# Patient Record
Sex: Male | Born: 1985 | Race: White | Hispanic: No | Marital: Single | Smoking: Former smoker
Health system: Southern US, Community
[De-identification: ages and names within clinical notes are randomized; demographics above are authoritative.]

## PROBLEM LIST (undated history)

## (undated) DIAGNOSIS — G4730 Sleep apnea, unspecified: Secondary | ICD-10-CM

## (undated) DIAGNOSIS — M7580 Other shoulder lesions, unspecified shoulder: Secondary | ICD-10-CM

## (undated) DIAGNOSIS — K219 Gastro-esophageal reflux disease without esophagitis: Secondary | ICD-10-CM

## (undated) DIAGNOSIS — F32A Depression, unspecified: Secondary | ICD-10-CM

## (undated) DIAGNOSIS — F419 Anxiety disorder, unspecified: Secondary | ICD-10-CM

## (undated) DIAGNOSIS — F329 Major depressive disorder, single episode, unspecified: Secondary | ICD-10-CM

## (undated) DIAGNOSIS — M1990 Unspecified osteoarthritis, unspecified site: Secondary | ICD-10-CM

## (undated) DIAGNOSIS — T7840XA Allergy, unspecified, initial encounter: Secondary | ICD-10-CM

## (undated) HISTORY — DX: Sleep apnea, unspecified: G47.30

## (undated) HISTORY — DX: Allergy, unspecified, initial encounter: T78.40XA

## (undated) HISTORY — DX: Anxiety disorder, unspecified: F41.9

## (undated) HISTORY — DX: Depression, unspecified: F32.A

## (undated) HISTORY — DX: Unspecified osteoarthritis, unspecified site: M19.90

## (undated) HISTORY — DX: Gastro-esophageal reflux disease without esophagitis: K21.9

---
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
## 2019-04-06 ENCOUNTER — Other Ambulatory Visit: Payer: Self-pay

## 2019-04-06 ENCOUNTER — Emergency Department (HOSPITAL_COMMUNITY)
Admission: EM | Admit: 2019-04-06 | Discharge: 2019-04-06 | Disposition: A | Discharge status: Home / Self Care | Payer: Medicaid - Out of State | Attending: Emergency Medicine | Admitting: Emergency Medicine

## 2019-04-06 ENCOUNTER — Encounter (HOSPITAL_COMMUNITY): Payer: Self-pay | Admitting: Emergency Medicine

## 2019-04-06 DIAGNOSIS — T162XXA Foreign body in left ear, initial encounter: Secondary | ICD-10-CM | POA: Diagnosis present

## 2019-04-06 DIAGNOSIS — Y929 Unspecified place or not applicable: Secondary | ICD-10-CM | POA: Diagnosis not present

## 2019-04-06 DIAGNOSIS — Y939 Activity, unspecified: Secondary | ICD-10-CM | POA: Insufficient documentation

## 2019-04-06 DIAGNOSIS — Z87891 Personal history of nicotine dependence: Secondary | ICD-10-CM | POA: Insufficient documentation

## 2019-04-06 DIAGNOSIS — Y999 Unspecified external cause status: Secondary | ICD-10-CM | POA: Diagnosis not present

## 2019-04-06 DIAGNOSIS — Y33XXXA Other specified events, undetermined intent, initial encounter: Secondary | ICD-10-CM | POA: Insufficient documentation

## 2019-04-06 MED ORDER — MINERAL OIL PO OIL
TOPICAL_OIL | Freq: Once | ORAL | Status: DC
  Filled 2019-04-06 (×2): qty 30

## 2019-04-06 NOTE — Discharge Instructions (Addendum)
Follow-up with the ear doctor as necessary.  Return to the ED with new or worsening symptoms

## 2019-04-06 NOTE — ED Triage Notes (Signed)
Pt states he was woken up by a bug "crawling in my ear."

## 2019-04-06 NOTE — ED Provider Notes (Signed)
[facility] EMERGENCY DEPARTMENT Provider Note   CSN: [id number] Arrival date & time: 04/06/19  0142     History   Chief Complaint Chief Complaint  Patient presents with  . Foreign Body in Ear    HPI Walter Lambert is a 33 y.o. male.     Patient presents with foreign body in his left ear. He believes it is a bug. States it woke him from sleep with a crawling sensation and pain in his left ear.  This happened around 12:30 PM.  He denies any drainage or bleeding.  He states he can still hear out of his ear.  No other medical history.  The history is provided by the patient.  Foreign Body in Ear Pertinent negatives include no chest pain, no abdominal pain, no headaches and no shortness of breath.    History reviewed. No pertinent past medical history.  There are no active problems to display for this patient.   History reviewed. No pertinent surgical history.      Home Medications    Prior to Admission medications   Not on File    Family History No family history on file.  Social History Social History   Tobacco Use  . Smoking status: Former Games developermoker  . Smokeless tobacco: Never Used  Substance Use Topics  . Alcohol use: Never    Frequency: Never  . Drug use: Never     Allergies   Nsaids   Review of Systems Review of Systems  Constitutional: Negative for activity change, appetite change and fever.  HENT: Positive for ear pain. Negative for ear discharge.   Eyes: Negative for visual disturbance.  Respiratory: Negative for cough, chest tightness and shortness of breath.   Cardiovascular: Negative for chest pain.  Gastrointestinal: Negative for abdominal pain, nausea and vomiting.  Genitourinary: Negative for dysuria and hematuria.  Musculoskeletal: Negative for arthralgias and myalgias.  Skin: Negative for wound.  Neurological: Negative for dizziness and headaches.   all other systems are negative except as noted in the HPI and PMH.     Physical  Exam Updated Vital Signs BP 134/80 (BP Location: Left Arm)   Pulse 76   Temp 98.2 F (36.8 C) (Oral)   Resp 18   SpO2 100%   Physical Exam Vitals signs and nursing note reviewed.  Constitutional:      General: He is not in acute distress.    Appearance: He is well-developed.  HENT:     Head: Normocephalic and atraumatic.     Comments: No tragus or mastoid pain.  Bug present in left ear canal.  TM appears intact No bleeding or drainage    Mouth/Throat:     Pharynx: No oropharyngeal exudate.  Eyes:     Conjunctiva/sclera: Conjunctivae normal.     Pupils: Pupils are equal, round, and reactive to light.  Neck:     Musculoskeletal: Normal range of motion and neck supple.     Comments: No meningismus. Cardiovascular:     Rate and Rhythm: Normal rate and regular rhythm.     Heart sounds: Normal heart sounds. No murmur.  Pulmonary:     Effort: Pulmonary effort is normal. No respiratory distress.     Breath sounds: Normal breath sounds.  Abdominal:     Palpations: Abdomen is soft.     Tenderness: There is no abdominal tenderness. There is no guarding or rebound.  Musculoskeletal: Normal range of motion.        General: No tenderness.  Skin:  General: Skin is warm.  Neurological:     Mental Status: He is alert and oriented to person, place, and time.     Cranial Nerves: No cranial nerve deficit.     Motor: No abnormal muscle tone.     Coordination: Coordination normal.     Comments: No ataxia on finger to nose bilaterally. No pronator drift. 5/5 strength throughout. CN 2-12 intact.Equal grip strength. Sensation intact.   Psychiatric:        Behavior: Behavior normal.      ED Treatments / Results  Labs (all labs ordered are listed, but only abnormal results are displayed) Labs Reviewed - No data to display  EKG    Radiology No results found.  Procedures .Foreign Body Removal  Date/Time: 04/06/2019 3:33 AM Performed by: Ezequiel Essex, MD Authorized by:  Ezequiel Essex, MD  Consent: The procedure was performed in an emergent situation. Verbal consent obtained. Risks and benefits: risks, benefits and alternatives were discussed Consent given by: patient Patient understanding: patient states understanding of the procedure being performed Patient identity confirmed: verbally with patient and provided demographic data Time out: Immediately prior to procedure a "time out" was called to verify the correct patient, procedure, equipment, support staff and site/side marked as required. Body area: ear Location details: left ear  Sedation: Patient sedated: no  Patient restrained: no Patient cooperative: yes Localization method: ENT speculum and visualized Removal mechanism: irrigation Complexity: simple 1 objects recovered. Objects recovered: bug Post-procedure assessment: foreign body removed Patient tolerance: patient tolerated the procedure well with no immediate complications   (including critical care time)  Medications Ordered in ED Medications  mineral oil liquid (has no administration in time range)     Initial Impression / Assessment and Plan / ED Course  I have reviewed the triage vital signs and the nursing notes.  Pertinent labs & imaging results that were available during my care of the patient were reviewed by me and considered in my medical decision making (see chart for details).      Bug in left ear canal.  This was successfully removed with flushing.  TM appears to be intact.  Patient to follow-up with ENT prn.  Return precautions discussed.  Final Clinical Impressions(s) / ED Diagnoses   Final diagnoses:  Foreign body of left ear, initial encounter    ED Discharge Orders    None       Coren Sagan, Annie Main, MD 04/06/19 [phone]

## 2019-09-03 ENCOUNTER — Encounter (INDEPENDENT_AMBULATORY_CARE_PROVIDER_SITE_OTHER): Payer: Self-pay

## 2019-09-03 ENCOUNTER — Encounter: Payer: Self-pay | Admitting: Family Medicine

## 2019-09-03 ENCOUNTER — Other Ambulatory Visit: Payer: Self-pay

## 2019-09-03 ENCOUNTER — Ambulatory Visit (INDEPENDENT_AMBULATORY_CARE_PROVIDER_SITE_OTHER): Payer: Medicaid Other | Admitting: Family Medicine

## 2019-09-03 VITALS — BP 115/80 | HR 65 | Temp 98.2°F | Resp 15 | Ht 74.0 in | Wt 255.8 lb

## 2019-09-03 DIAGNOSIS — M25561 Pain in right knee: Secondary | ICD-10-CM | POA: Diagnosis not present

## 2019-09-03 DIAGNOSIS — M25531 Pain in right wrist: Secondary | ICD-10-CM

## 2019-09-03 DIAGNOSIS — H539 Unspecified visual disturbance: Secondary | ICD-10-CM | POA: Diagnosis not present

## 2019-09-03 DIAGNOSIS — K219 Gastro-esophageal reflux disease without esophagitis: Secondary | ICD-10-CM | POA: Insufficient documentation

## 2019-09-03 DIAGNOSIS — M25562 Pain in left knee: Secondary | ICD-10-CM

## 2019-09-03 DIAGNOSIS — F840 Autistic disorder: Secondary | ICD-10-CM

## 2019-09-03 MED ORDER — OMEPRAZOLE 40 MG PO CPDR: 40.0000 mg | DELAYED_RELEASE_CAPSULE | Freq: Every day | ORAL | 11 refills | Status: AC

## 2019-09-03 NOTE — Progress Notes (Signed)
New Patient Office Visit  Subjective:  Patient ID: Walter Lambert, male    DOB: 04/29/1986  Age: 33 y.o. MRN: [id number]  CC:  Chief Complaint  Patient presents with  . New Patient (Initial Visit)    establish care  mental health concerns-needs assessment by psy-previously cared for in [place] by mental health-moved with girl friend to area to be near her uncle/aunt. Pt seen at [facility] for intake but no psy available  HPI Walter Lambert presents for GERD-needs a refill on omeprazole-abdominal symptoms without taking-currently using otc strength x2 Right wrist pain since assisting girl friend uncle cutting wood. Pt states he used a chain saw for several days 3 weeks ago and now has ongoing tenderness and pain. NO h/o carpal tunnel. 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  Pt with bilat knee pain since childhood when brother hurt pt playing. No evaluation in the past. No xrays. Pt with no difficulty walking. Long term standing causes pain.  Extensive psy history with diagnosis of Autism spectrum in his 20's then diagnosis of  schizo affective d/o with bipolar 1, anxiety and most recent possible PTSD. Pt relies on his girl friend who is blind to monitor "his schedule" and states that she can sense when he is having trouble. Pt has not been treated since arriving in [place] in June. Pt has sole custody of his 40yo son and is helping girl friend care for her 43yo daughter.  Pt is currently un-employed.   Past Medical History:  Diagnosis Date  . Anxiety   . Arthritis   . Depression   . GERD (gastroesophageal reflux disease)     No past surgical history on file.  Family History  Problem Relation Age of Onset  . Mental illness Mother     Social History  Engineer, structural -delivery driver Lives with girl friend-blind-able to see light and shapes, 10yo (girl friend daughter) 4yo (son-tibal torsion-child with braces)-sole custody. Living with girl friend uncle and aunt. Moved from [place] in  June Socioeconomic History  . Marital status: Single    Spouse name: Not on file  . Number of children: Not on file  . Years of education: Not on file  . Highest education level: Not on file  Occupational History  . Not on file  Social Needs  . Financial resource strain: Not on file  . Food insecurity    Worry: Not on file    Inability: Not on file  . Transportation needs    Medical: Not on file    Non-medical: Not on file  Tobacco Use  . Smoking status: Former Research scientist (life sciences)  . Smokeless tobacco: Never Used  Substance and Sexual Activity  . Alcohol use: Yes    Frequency: Never  . Drug use: Never  . Sexual activity: Not Currently  Lifestyle  . Physical activity    Days per week: Not on file    Minutes per session: Not on file  . Stress: Not on file  Relationships  . Social Herbalist on phone: Not on file    Gets together: Not on file    Attends religious service: Not on file    Active member of club or organization: Not on file    Attends meetings of clubs or organizations: Not on file    Relationship status: Not on file  . Intimate partner violence    Fear of current or ex partner: Not on file    Emotionally abused: Not on  file    Physically abused: Not on file    Forced sexual activity: Not on file  Other Topics Concern  . Not on file  Social History Narrative  . Not on file    ROS Review of Systems  Constitutional: Negative.   HENT: Negative.   Eyes:       Glasses-bifocals not working  Gastrointestinal:       Reflux  Endocrine: Negative.   Genitourinary: Negative.   Skin: Negative.   Allergic/Immunologic: Positive for environmental allergies.  Neurological: Negative.   Hematological: Negative.   Psychiatric/Behavioral: The patient is nervous/anxious.        Schizo affective, bipolar type 1-diagnosis Autism spectrum Major depressive mood disorder Anxiety d/o Possible PTSD-[facility]     Objective:   Today's Vitals: BP 115/80   Pulse 65    Temp 98.2 F (36.8 C) (Oral)   Resp 15   Ht 6\' 2"  (1.88 m)   Wt 255 lb 12.8 oz (116 kg)   SpO2 98%   BMI 32.84 kg/m   Physical Exam Constitutional:      Appearance: Normal appearance. He is obese.  HENT:     Head: Normocephalic and atraumatic.     Right Ear: Tympanic membrane, ear canal and external ear normal.     Left Ear: Tympanic membrane, ear canal and external ear normal.     Nose: Nose normal.     Mouth/Throat:     Mouth: Mucous membranes are moist.  Eyes:     Conjunctiva/sclera: Conjunctivae normal.  Neck:     Musculoskeletal: Normal range of motion and neck supple.  Cardiovascular:     Rate and Rhythm: Normal rate and regular rhythm.  Pulmonary:     Effort: Pulmonary effort is normal.     Breath sounds: Normal breath sounds.  Musculoskeletal:        General: Tenderness present.     Comments: Knee pain bilat-right "gives out" causing instablity-injury by older brother during development  Right wrist pain-stiffness with popping-pt states ongoing pain for 3 weeks.  Tylenol-left handed for writing  Neurological:     Mental Status: He is alert and oriented to person, place, and time.     Assessment & Plan:   1. Autism spectrum disorder Diagnosis at age 57-no psy evaluation since moving to [place] in June. Pt with intake evaluation at [facility] in [place] but was told no psy in [place] for follow up-pt request Behavioral Health in [place] - Ambulatory referral to Psychiatry  2. Visual changes Glasses are not utd-pt request appt for evaluation - Ambulatory referral to Ophthalmology  3. Pain in both knees, unspecified chronicity No gait abnormality, no swelling or tenderness to evaluation - Ambulatory referral to Orthopedic Surgery  4. Right wrist pain Wrist splint-rest x 2 weeks-f/u with ortho if no improvement-pt unable to take NSAIDs  5. Gastroesophageal reflux disease without esophagitis Omeprazole -rx Outpatient Encounter Medications as of 09/03/2019   Medication Sig  . omeprazole (PRILOSEC) 20 MG capsule Take 40 mg by mouth daily.   No facility-administered encounter medications on file as of 09/03/2019.     Follow-up: mental health for ongoing treatment 1 year for GERD Ortho for knee and wrist pain   09/05/2019, MD

## 2019-09-03 NOTE — Patient Instructions (Addendum)
capsacin -apply to the wrist 3-4 times a day Wrist splint  Ortho referral Behavioral health referral  rx sent to pharmacy for omeprazole

## 2019-10-02 ENCOUNTER — Ambulatory Visit: Payer: Medicaid Other | Admitting: Orthopaedic Surgery

## 2019-10-02 ENCOUNTER — Ambulatory Visit: Payer: Medicaid Other

## 2019-10-02 ENCOUNTER — Other Ambulatory Visit: Payer: Self-pay

## 2019-10-02 ENCOUNTER — Encounter: Payer: Self-pay | Admitting: Orthopaedic Surgery

## 2019-10-02 VITALS — BP 119/79 | HR 70 | Ht 73.0 in | Wt 245.0 lb

## 2019-10-02 DIAGNOSIS — R202 Paresthesia of skin: Secondary | ICD-10-CM

## 2019-10-02 DIAGNOSIS — M25531 Pain in right wrist: Secondary | ICD-10-CM

## 2019-10-02 NOTE — Progress Notes (Signed)
Subjective:    Patient ID: Walter Lambert, male    DOB: [birth year], 33 y.o.   MRN: [id number]  HPI He has pain in the right wrist for over six weeks.  He used a chain saw then and has had pain in the wrist since then.  He has a deep pain.  He started getting some numbness in all the fingers over the last day or two.  He has no other trauma  He has no redness.  He has only taken Tylenol with no help.  He has some knee pain bilaterally but the wrist pain is his chief problem today.    I have reviewed the notes from his family physician.     Review of Systems  Constitutional: Positive for activity change.  Musculoskeletal: Positive for arthralgias and myalgias.  Psychiatric/Behavioral: The patient is nervous/anxious.   All other systems reviewed and are negative.  For Review of Systems, all other systems reviewed and are negative.  The following is a summary of the past history medically, past history surgically, known current medicines, social history and family history.  This information is gathered electronically by the computer from prior information and documentation.  I review this each visit and have found including this information at this point in the chart is beneficial and informative.   Past Medical History:  Diagnosis Date  . Anxiety   . Arthritis   . Depression   . GERD (gastroesophageal reflux disease)     History reviewed. No pertinent surgical history.  Current Outpatient Medications on File Prior to Visit  Medication Sig Dispense Refill  . omeprazole (PRILOSEC) 40 MG capsule Take 1 capsule (40 mg total) by mouth daily. 30 capsule 11   No current facility-administered medications on file prior to visit.    Social History   Socioeconomic History  . Marital status: Single    Spouse name: Not on file  . Number of children: Not on file  . Years of education: Not on file  . Highest education level: Not on file  Occupational History  . Not on file  Tobacco Use   . Smoking status: Former Research scientist (life sciences)  . Smokeless tobacco: Never Used  Substance and Sexual Activity  . Alcohol use: Yes  . Drug use: Never  . Sexual activity: Not Currently  Other Topics Concern  . Not on file  Social History Narrative  . Not on file   Social Determinants of Health   Financial Resource Strain:   . Difficulty of Paying Living Expenses: Not on file  Food Insecurity:   . Worried About Charity fundraiser in the Last Year: Not on file  . Ran Out of Food in the Last Year: Not on file  Transportation Needs:   . Lack of Transportation (Medical): Not on file  . Lack of Transportation (Non-Medical): Not on file  Physical Activity:   . Days of Exercise per Week: Not on file  . Minutes of Exercise per Session: Not on file  Stress:   . Feeling of Stress : Not on file  Social Connections:   . Frequency of Communication with Friends and Family: Not on file  . Frequency of Social Gatherings with Friends and Family: Not on file  . Attends Religious Services: Not on file  . Active Member of Clubs or Organizations: Not on file  . Attends Archivist Meetings: Not on file  . Marital Status: Not on file  Intimate Partner Violence:   . Fear of  Current or Ex-Partner: Not on file  . Emotionally Abused: Not on file  . Physically Abused: Not on file  . Sexually Abused: Not on file    Family History  Problem Relation Age of Onset  . Mental illness Mother   . Mental illness Father   . Diabetes Maternal Grandmother     BP 119/79   Pulse 70   Ht 6\' 1"  (1.854 m)   Wt 245 lb (111.1 kg)   BMI 32.32 kg/m   Body mass index is 32.32 kg/m.     Objective:   Physical Exam Vitals reviewed.  Constitutional:      Appearance: He is well-developed.  HENT:     Head: Normocephalic and atraumatic.  Eyes:     Conjunctiva/sclera: Conjunctivae normal.     Pupils: Pupils are equal, round, and reactive to light.  Cardiovascular:     Rate and Rhythm: Normal rate and regular  rhythm.  Pulmonary:     Effort: Pulmonary effort is normal.  Abdominal:     Palpations: Abdomen is soft.  Musculoskeletal:     Right wrist: Tenderness present.     Left wrist: Normal.       Arms:     Cervical back: Normal range of motion and neck supple.  Skin:    General: Skin is warm and dry.  Neurological:     Mental Status: He is alert and oriented to person, place, and time.     Cranial Nerves: No cranial nerve deficit.     Motor: No abnormal muscle tone.     Coordination: Coordination normal.     Deep Tendon Reflexes: Reflexes are normal and symmetric. Reflexes normal.  Psychiatric:        Behavior: Behavior normal.        Thought Content: Thought content normal.        Judgment: Judgment normal.     X-rays were done of the right wrist, reported separately.      Assessment & Plan:   Encounter Diagnosis  Name Primary?  . Pain in right wrist Yes   I will get EMGs to rule out carpal tunnel.  I will provide a cock-up splint for him to wear.  He cannot take any NSAIDs.  I have recommended BioFreeze, Voltaren gel or Aspercreme.  Return after EMGs.  Call if any problem.  Precautions discussed.  Electronically Signed , MD 12/22/20208:35 AM

## 2019-10-31 ENCOUNTER — Ambulatory Visit (INDEPENDENT_AMBULATORY_CARE_PROVIDER_SITE_OTHER): Payer: Medicaid Other | Admitting: Physical Medicine and Rehabilitation

## 2019-10-31 ENCOUNTER — Encounter: Payer: Self-pay | Admitting: Physical Medicine and Rehabilitation

## 2019-10-31 ENCOUNTER — Other Ambulatory Visit: Payer: Self-pay

## 2019-10-31 DIAGNOSIS — R202 Paresthesia of skin: Secondary | ICD-10-CM | POA: Diagnosis not present

## 2019-10-31 NOTE — Progress Notes (Signed)
 .  Numeric Pain Rating Scale and Functional Assessment Average Pain 0   In the last MONTH (on 0-10 scale) has pain interfered with the following?  1. General activity like being  able to carry out your everyday physical activities such as walking, climbing stairs, carrying groceries, or moving a chair?  Rating(5)     

## 2019-11-02 NOTE — Procedures (Signed)
EMG & NCV Findings: All nerve conduction studies (as indicated in the following tables) were within normal limits.    Needle evaluation of the right pronator teres, the right biceps, and the right deltoid muscles showed increased insertional activity.  All remaining muscles (as indicated in the following table) showed no evidence of electrical instability.    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    There is no significant electrodiagnostic evidence of any focal nerve entrapment (part circularly median nerve entrapment at the wrist), brachial plexopathy or generalized peripheral neuropathy.   Recommendations: 1.  Follow-up with referring physician. 2.  Continue current management of symptoms. 3.  Suggest MRI of the cervical spine if this is felt to be clinically relevant.  His wrist pain is likely musculotendinous in nature from his activities recently.  ___________________________ Laurence Spates [facility] Board Certified, American Board of Physical Medicine and Rehabilitation    Nerve Conduction Studies Anti Sensory Summary Table   Stim Site NR Peak (ms) Norm Peak (ms) P-T Amp (V) Norm P-T Amp Site1 Site2 Delta-P (ms) Dist (cm) Vel (m/s) Norm Vel (m/s)  Right Median Acr Palm Anti Sensory (2nd Digit)  28.8C  Wrist    3.3 <3.6 19.4 >10 Wrist Palm 1.5 0.0    Palm    1.8 <2.0 24.8         Right Radial Anti Sensory (Base 1st Digit)  29.2C  Wrist    2.1 <3.1 16.3  Wrist Base 1st Digit 2.1 0.0    Right Ulnar Anti Sensory (5th Digit)  29.2C  Wrist    3.2 <3.7 30.9 >15.0 Wrist 5th Digit 3.2 14.0 44 >38   Motor Summary Table   Stim Site NR Onset (ms) Norm Onset (ms) O-P Amp (mV) Norm O-P Amp Site1 Site2 Delta-0 (ms) Dist (cm) Vel (m/s) Norm Vel (m/s)  Right Median Motor (Abd Poll Brev)  29.6C  Wrist    3.6 <4.2 10.1 >5 Elbow Wrist 4.4 23.0 52 >50  Elbow    8.0  10.1         Right Ulnar Motor  (Abd Dig Min)  29.9C  Wrist    3.2 <4.2 10.9 >3 B Elbow Wrist 3.8 24.5 64 >53  B Elbow    7.0  10.5  A Elbow B Elbow 1.1 10.0 91 >53  A Elbow    8.1  10.4          EMG   Side Muscle Nerve Root Ins Act Fibs Psw Amp Dur Poly Recrt Int Fraser Din Comment  Right Abd Poll Brev Median C8-T1 Nml Nml Nml Nml Nml 0 Nml Nml   Right 1stDorInt Ulnar C8-T1 Nml Nml Nml Nml Nml 0 Nml Nml   Right PronatorTeres Median C6-7 *Incr Nml Nml Nml Nml 0 Nml Nml   Right Biceps Musculocut C5-6 *Incr Nml Nml Nml Nml 0 Nml Nml   Right Deltoid Axillary C5-6 *Incr Nml Nml Nml Nml 0 Nml Nml     Nerve Conduction Studies Anti Sensory Left/Right Comparison   Stim Site L Lat (ms) R Lat (ms) L-R Lat (ms) L Amp (V) R Amp (V) L-R Amp (%) Site1 Site2 L Vel (m/s) R Vel (m/s) L-R Vel (m/s)  Median Acr Palm Anti Sensory (2nd Digit)  28.8C  Wrist  3.3   19.4  Wrist Palm     Palm  1.8   24.8  Radial Anti Sensory (Base 1st Digit)  29.2C  Wrist  2.1   16.3  Wrist Base 1st Digit     Ulnar Anti Sensory (5th Digit)  29.2C  Wrist  3.2   30.9  Wrist 5th Digit  44    Motor Left/Right Comparison   Stim Site L Lat (ms) R Lat (ms) L-R Lat (ms) L Amp (mV) R Amp (mV) L-R Amp (%) Site1 Site2 L Vel (m/s) R Vel (m/s) L-R Vel (m/s)  Median Motor (Abd Poll Brev)  29.6C  Wrist  3.6   10.1  Elbow Wrist  52   Elbow  8.0   10.1        Ulnar Motor (Abd Dig Min)  29.9C  Wrist  3.2   10.9  B Elbow Wrist  64   B Elbow  7.0   10.5  A Elbow B Elbow  91   A Elbow  8.1   10.4           Waveforms:

## 2019-11-02 NOTE — Progress Notes (Signed)
Walter Lambert - 34 y.o. male MRN [id number]  Date of birth: 1985/10/15  Office Visit Note: Visit Date: 10/31/2019 PCP: Wandra Feinstein, MD Referred by: Wandra Feinstein, MD  Subjective: Chief Complaint  Patient presents with  . Right Hand - Numbness, Tingling   HPI: Walter Lambert is a 33 y.o. male who comes in today At the request of Dr. Darreld Mclean for electrodiagnostic study of the right upper limb.  He is actually left hand dominant but reports symptoms in the right hand since December.  He reports chopping wood and using a chainsaw with a lot of pain in the hand with numbness.  He initially reports numbness globally in all the fingers but really points to the middle 3 digits.  He has no symptoms on the left.  He states using his hand makes the pain worse.  The numbness is pretty constant.  He moved from [place] to [place] this past year.  He has had no prior electrodiagnostic studies.  He is specifically not really endorsing radicular pain or paresthesia.  He does have neck pain.  His history is complicated by diagnoses of autism spectrum disorder as well as schizoaffective disorder and bipolar disease.  ROS Otherwise per HPI.  Assessment & Plan: Visit Diagnoses:  1. Paresthesia of skin     Plan: Impression: The above electrodiagnostic study is ABNORMAL and reveals evidence of a mild chronic C5 and C6 radiculopathy on the right.    There is no significant electrodiagnostic evidence of any focal nerve entrapment (part circularly median nerve entrapment at the wrist), brachial plexopathy or generalized peripheral neuropathy.   Recommendations: 1.  Follow-up with referring physician. 2.  Continue current management of symptoms. 3.  Suggest MRI of the cervical spine if this is felt to be clinically relevant.  His wrist pain is likely musculotendinous in nature from his activities recently.   Meds & Orders: No orders of the defined types were placed in this encounter.   Orders  Placed This Encounter  Procedures  . NCV with EMG (electromyography)    Follow-up: Return for Darreld Mclean, MD.   Procedures: No procedures performed  EMG & NCV Findings: All nerve conduction studies (as indicated in the following tables) were within normal limits.    Needle evaluation of the right pronator teres, the right biceps, and the right deltoid muscles showed increased insertional activity.  All remaining muscles (as indicated in the following table) showed no evidence of electrical instability.    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    There is no significant electrodiagnostic evidence of any focal nerve entrapment (part circularly median nerve entrapment at the wrist), brachial plexopathy or generalized peripheral neuropathy.   Recommendations: 1.  Follow-up with referring physician. 2.  Continue current management of symptoms. 3.  Suggest MRI of the cervical spine if this is felt to be clinically relevant.  His wrist pain is likely musculotendinous in nature from his activities recently.  ___________________________ [name] [facility] Board Certified, American Board of Physical Medicine and Rehabilitation    Nerve Conduction Studies Anti Sensory Summary Table   Stim Site NR Peak (ms) Norm Peak (ms) P-T Amp (V) Norm P-T Amp Site1 Site2 Delta-P (ms) Dist (cm) Vel (m/s) Norm Vel (m/s)  Right Median Acr Palm Anti Sensory (2nd Digit)  28.8C  Wrist    3.3 <3.6 19.4 >10 Wrist Palm 1.5 0.0  Palm    1.8 <2.0 24.8         Right Radial Anti Sensory (Base 1st Digit)  29.2C  Wrist    2.1 <3.1 16.3  Wrist Base 1st Digit 2.1 0.0    Right Ulnar Anti Sensory (5th Digit)  29.2C  Wrist    3.2 <3.7 30.9 >15.0 Wrist 5th Digit 3.2 14.0 44 >38   Motor Summary Table   Stim Site NR Onset (ms) Norm Onset (ms) O-P Amp (mV) Norm O-P Amp Site1 Site2 Delta-0 (ms) Dist (cm) Vel (m/s) Norm  Vel (m/s)  Right Median Motor (Abd Poll Brev)  29.6C  Wrist    3.6 <4.2 10.1 >5 Elbow Wrist 4.4 23.0 52 >50  Elbow    8.0  10.1         Right Ulnar Motor (Abd Dig Min)  29.9C  Wrist    3.2 <4.2 10.9 >3 B Elbow Wrist 3.8 24.5 64 >53  B Elbow    7.0  10.5  A Elbow B Elbow 1.1 10.0 91 >53  A Elbow    8.1  10.4          EMG   Side Muscle Nerve Root Ins Act Fibs Psw Amp Dur Poly Recrt Int Fraser Din Comment  Right Abd Poll Brev Median C8-T1 Nml Nml Nml Nml Nml 0 Nml Nml   Right 1stDorInt Ulnar C8-T1 Nml Nml Nml Nml Nml 0 Nml Nml   Right PronatorTeres Median C6-7 *Incr Nml Nml Nml Nml 0 Nml Nml   Right Biceps Musculocut C5-6 *Incr Nml Nml Nml Nml 0 Nml Nml   Right Deltoid Axillary C5-6 *Incr Nml Nml Nml Nml 0 Nml Nml     Nerve Conduction Studies Anti Sensory Left/Right Comparison   Stim Site L Lat (ms) R Lat (ms) L-R Lat (ms) L Amp (V) R Amp (V) L-R Amp (%) Site1 Site2 L Vel (m/s) R Vel (m/s) L-R Vel (m/s)  Median Acr Palm Anti Sensory (2nd Digit)  28.8C  Wrist  3.3   19.4  Wrist Palm     Palm  1.8   24.8        Radial Anti Sensory (Base 1st Digit)  29.2C  Wrist  2.1   16.3  Wrist Base 1st Digit     Ulnar Anti Sensory (5th Digit)  29.2C  Wrist  3.2   30.9  Wrist 5th Digit  44    Motor Left/Right Comparison   Stim Site L Lat (ms) R Lat (ms) L-R Lat (ms) L Amp (mV) R Amp (mV) L-R Amp (%) Site1 Site2 L Vel (m/s) R Vel (m/s) L-R Vel (m/s)  Median Motor (Abd Poll Brev)  29.6C  Wrist  3.6   10.1  Elbow Wrist  52   Elbow  8.0   10.1        Ulnar Motor (Abd Dig Min)  29.9C  Wrist  3.2   10.9  B Elbow Wrist  64   B Elbow  7.0   10.5  A Elbow B Elbow  91   A Elbow  8.1   10.4           Waveforms:             Clinical History: No specialty comments available.   He reports that he has quit smoking. He has never used smokeless tobacco. No results for input(s): HGBA1C, LABURIC in the last 8760 hours.  Objective:  VS:  HT:    WT:   BMI:  BP:   HR: bpm  TEMP: ( )  RESP:   Physical Exam Musculoskeletal:        General: No tenderness.     Comments: Inspection reveals no atrophy of the bilateral APB or FDI or hand intrinsics. There is no swelling, color changes, allodynia or dystrophic changes. There is 5 out of 5 strength in the bilateral wrist extension, finger abduction and long finger flexion. There is intact sensation to light touch in all dermatomal and peripheral nerve distributions.  There is a negative Hoffmann's test bilaterally.  Skin:    General: Skin is warm and dry.     Findings: No erythema or rash.  Neurological:     General: No focal deficit present.     Mental Status: He is alert and oriented to person, place, and time.     Sensory: No sensory deficit.     Motor: No weakness or abnormal muscle tone.     Coordination: Coordination normal.     Gait: Gait normal.  Psychiatric:        Mood and Affect: Mood normal.        Behavior: Behavior normal.        Thought Content: Thought content normal.     Ortho Exam Imaging: No results found.  Past Medical/Family/Surgical/Social History: Medications & Allergies reviewed per EMR, new medications updated. Patient Active Problem List   Diagnosis Date Noted  . Visual changes 09/03/2019  . Pain in both knees 09/03/2019  . Autism spectrum disorder 09/03/2019  . Right wrist pain 09/03/2019  . Gastroesophageal reflux disease without esophagitis 09/03/2019   Past Medical History:  Diagnosis Date  . Anxiety   . Arthritis   . Depression   . GERD (gastroesophageal reflux disease)    Family History  Problem Relation Age of Onset  . Mental illness Mother   . Mental illness Father   . Diabetes Maternal Grandmother    History reviewed. No pertinent surgical history. Social History   Occupational History  . Not on file  Tobacco Use  . Smoking status: Former Games developer  . Smokeless tobacco: Never Used  Substance and Sexual Activity  . Alcohol use: Yes  . Drug use: Never  . Sexual activity: Not  Currently

## 2019-11-06 ENCOUNTER — Encounter: Payer: Self-pay | Admitting: Orthopaedic Surgery

## 2019-11-06 ENCOUNTER — Ambulatory Visit (INDEPENDENT_AMBULATORY_CARE_PROVIDER_SITE_OTHER): Payer: Medicaid Other | Admitting: Orthopaedic Surgery

## 2019-11-06 ENCOUNTER — Ambulatory Visit: Payer: Medicaid Other

## 2019-11-06 ENCOUNTER — Other Ambulatory Visit: Payer: Self-pay

## 2019-11-06 VITALS — BP 114/73 | HR 73 | Temp 97.5°F | Ht 73.0 in | Wt 245.0 lb

## 2019-11-06 DIAGNOSIS — R202 Paresthesia of skin: Secondary | ICD-10-CM

## 2019-11-06 DIAGNOSIS — M5412 Radiculopathy, cervical region: Secondary | ICD-10-CM

## 2019-11-06 NOTE — Progress Notes (Signed)
Patient Walter Lambert, male DOB:[birth year], 34 y.o. QPY:[id number]  Chief Complaint  Patient presents with  . Wrist Pain    Right wrist pain.    HPI  Walter Lambert is a 34 y.o. male who has numbness in the hand and wrists.  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  MRI recommended.  I have reviewed the notes.  I will get a MRI of the neck. X-rays of the cervical spine will be done today.  He understands and agrees.    Body mass index is 32.32 kg/m.  ROS  Review of Systems  Constitutional: Positive for activity change.  Musculoskeletal: Positive for arthralgias and myalgias.  Psychiatric/Behavioral: The patient is nervous/anxious.   All other systems reviewed and are negative.   All other systems reviewed and are negative.  The following is a summary of the past history medically, past history surgically, known current medicines, social history and family history.  This information is gathered electronically by the computer from prior information and documentation.  I review this each visit and have found including this information at this point in the chart is beneficial and informative.    Past Medical History:  Diagnosis Date  . Anxiety   . Arthritis   . Depression   . GERD (gastroesophageal reflux disease)     No past surgical history on file.  Family History  Problem Relation Age of Onset  . Mental illness Mother   . Mental illness Father   . Diabetes Maternal Grandmother     Social History Social History   Tobacco Use  . Smoking status: Former Research scientist (life sciences)  . Smokeless tobacco: Never Used  Substance Use Topics  . Alcohol use: Yes  . Drug use: Never    Allergies  Allergen Reactions  . Nsaids Hives    Other reaction(s): Hives  . Bee Venom     Other reaction(s): Influenza-like illness, Lightheadedness, Nausea / Vomiting  . Cortisone   . Nickel     Other reaction(s): Hives    Current Outpatient Medications   Medication Sig Dispense Refill  . omeprazole (PRILOSEC) 40 MG capsule Take 1 capsule (40 mg total) by mouth daily. 30 capsule 11   No current facility-administered medications for this visit.     Physical Exam  Blood pressure 114/73, pulse 73, temperature (!) 97.5 F (36.4 C), height 6\' 1"  (1.854 m), weight 245 lb (111.1 kg).  Constitutional: overall normal hygiene, normal nutrition, well developed, normal grooming, normal body habitus. Assistive device:none  Musculoskeletal: gait and station Limp none, muscle tone and strength are normal, no tremors or atrophy is present.  .  Neurological: coordination overall normal.  Deep tendon reflex/nerve stretch intact.  Sensation normal.  Cranial nerves II-XII intact.   Skin:   Normal overall no scars, lesions, ulcers or rashes. No psoriasis.  Psychiatric: Alert and oriented x 3.  Recent memory intact, remote memory unclear.  Normal mood and affect. Well groomed.  Good eye contact.  Cardiovascular: overall no swelling, no varicosities, no edema bilaterally, normal temperatures of the legs and arms, no clubbing, cyanosis and good capillary refill.  Lymphatic: palpation is normal.  All other systems reviewed and are negative   The patient has been educated about the nature of the problem(s) and counseled on treatment options.  The patient appeared to understand what I have discussed and is in agreement with it.  Encounter Diagnoses  Name Primary?  . Right hand paresthesia Yes  . Radiculopathy,  cervical region     PLAN Call if any problems.  Precautions discussed.  Continue current medications.   Return to clinic 2 weeks   Get MRI of the cervical spine.  Electronically Signed Darreld Mclean, MD 1/26/202110:51 AM

## 2019-11-20 ENCOUNTER — Ambulatory Visit: Payer: Medicaid Other | Admitting: Orthopaedic Surgery

## 2019-11-22 ENCOUNTER — Ambulatory Visit (INDEPENDENT_AMBULATORY_CARE_PROVIDER_SITE_OTHER): Payer: Medicaid Other | Admitting: Orthopaedic Surgery

## 2019-11-22 ENCOUNTER — Encounter: Payer: Self-pay | Admitting: Orthopaedic Surgery

## 2019-11-22 ENCOUNTER — Other Ambulatory Visit: Payer: Self-pay

## 2019-11-22 VITALS — BP 123/78 | HR 55 | Ht 73.0 in | Wt 240.0 lb

## 2019-11-22 DIAGNOSIS — M5412 Radiculopathy, cervical region: Secondary | ICD-10-CM

## 2019-11-22 NOTE — Progress Notes (Signed)
His MRI was denied.  We need to get the MRI.  I will need to do peer to peer.  No visit.  No charge.  Return after MRI neck.  Electronically Signed Darreld Mclean, MD 2/11/202110:19 AM

## 2019-12-05 ENCOUNTER — Ambulatory Visit (HOSPITAL_COMMUNITY): Payer: Medicaid Other | Admitting: Psychiatry

## 2019-12-12 ENCOUNTER — Ambulatory Visit (HOSPITAL_COMMUNITY)
Admission: RE | Admit: 2019-12-12 | Discharge: 2019-12-12 | Disposition: A | Discharge status: Home / Self Care | Payer: Medicaid Other | Source: Ambulatory Visit | Attending: Orthopaedic Surgery | Admitting: Orthopaedic Surgery

## 2019-12-12 ENCOUNTER — Other Ambulatory Visit: Payer: Self-pay

## 2019-12-12 DIAGNOSIS — M5412 Radiculopathy, cervical region: Secondary | ICD-10-CM

## 2019-12-20 ENCOUNTER — Other Ambulatory Visit: Payer: Self-pay

## 2019-12-20 ENCOUNTER — Ambulatory Visit (INDEPENDENT_AMBULATORY_CARE_PROVIDER_SITE_OTHER): Payer: Medicaid Other | Admitting: Orthopaedic Surgery

## 2019-12-20 ENCOUNTER — Encounter: Payer: Self-pay | Admitting: Orthopaedic Surgery

## 2019-12-20 VITALS — BP 108/74 | HR 62 | Temp 97.6°F | Ht 73.0 in | Wt 238.0 lb

## 2019-12-20 DIAGNOSIS — R202 Paresthesia of skin: Secondary | ICD-10-CM | POA: Diagnosis not present

## 2019-12-20 NOTE — Progress Notes (Signed)
Patient VO:HYWVPXT Berling, male DOB:June 10, 1986, 34 y.o. GGY:[id number]  Chief Complaint  Patient presents with  . Neck Pain    Neck pain    HPI  Walter Lambert is a 34 y.o. male who has right hand paresthesias.  I was concerned about carpal tunnel but the EMG report showed most likely problem of neck C5-C6.  He had MRI of the neck which showed: IMPRESSION: Minimal degenerative disc disease at C3-4 and C6-7. Otherwise, normal MRI of the cervical spine.  I have reviewed this independently.  I have explained the findings.  I do not know why he has numbness of the right hand.  I will have hand surgeon see him for further evaluation.  Patient is agreeable to this.   Body mass index is 31.4 kg/m.  ROS  Review of Systems  Constitutional: Positive for activity change.  Musculoskeletal: Positive for arthralgias and myalgias.  Psychiatric/Behavioral: The patient is nervous/anxious.   All other systems reviewed and are negative.   All other systems reviewed and are negative.  The following is a summary of the past history medically, past history surgically, known current medicines, social history and family history.  This information is gathered electronically by the computer from prior information and documentation.  I review this each visit and have found including this information at this point in the chart is beneficial and informative.    Past Medical History:  Diagnosis Date  . Anxiety   . Arthritis   . Depression   . GERD (gastroesophageal reflux disease)     History reviewed. No pertinent surgical history.  Family History  Problem Relation Age of Onset  . Mental illness Mother   . Mental illness Father   . Diabetes Maternal Grandmother     Social History Social History   Tobacco Use  . Smoking status: Former Research scientist (life sciences)  . Smokeless tobacco: Never Used  Substance Use Topics  . Alcohol use: Yes  . Drug use: Never    Allergies  Allergen Reactions  . Nsaids  Hives    Other reaction(s): Hives  . Bee Venom     Other reaction(s): Influenza-like illness, Lightheadedness, Nausea / Vomiting  . Cortisone   . Nickel     Other reaction(s): Hives    Current Outpatient Medications  Medication Sig Dispense Refill  . omeprazole (PRILOSEC) 40 MG capsule Take 1 capsule (40 mg total) by mouth daily. 30 capsule 11   No current facility-administered medications for this visit.     Physical Exam  Blood pressure 108/74, pulse 62, temperature 97.6 F (36.4 C), height 6\' 1"  (1.854 m), weight 238 lb (108 kg).  Constitutional: overall normal hygiene, normal nutrition, well developed, normal grooming, normal body habitus. Assistive device:none  Musculoskeletal: gait and station Limp none, muscle tone and strength are normal, no tremors or atrophy is present.  .  Neurological: coordination overall normal.  Deep tendon reflex/nerve stretch intact.  Sensation normal.  Cranial nerves II-XII intact.   Skin:   Normal overall no scars, lesions, ulcers or rashes. No psoriasis.  Psychiatric: Alert and oriented x 3.  Recent memory intact, remote memory unclear.  Normal mood and affect. Well groomed.  Good eye contact.  Cardiovascular: overall no swelling, no varicosities, no edema bilaterally, normal temperatures of the legs and arms, no clubbing, cyanosis and good capillary refill.  Lymphatic: palpation is normal.  He complains of numbness of the right hand of finger tips in median nerve distribution.  All other systems reviewed and are negative  The patient has been educated about the nature of the problem(s) and counseled on treatment options.  The patient appeared to understand what I have discussed and is in agreement with it.  Encounter Diagnosis  Name Primary?  . Right hand paresthesia Yes    PLAN Call if any problems.  Precautions discussed.  Continue current medications.   Return to clinic to hand surgeon   Electronically Signed Darreld Mclean, MD 3/11/202110:37 AM

## 2019-12-26 ENCOUNTER — Encounter (HOSPITAL_COMMUNITY): Payer: Self-pay | Admitting: *Deleted

## 2019-12-26 ENCOUNTER — Other Ambulatory Visit: Payer: Self-pay

## 2019-12-26 ENCOUNTER — Emergency Department (HOSPITAL_COMMUNITY): Payer: Medicaid Other

## 2019-12-26 ENCOUNTER — Emergency Department (HOSPITAL_COMMUNITY)
Admission: EM | Admit: 2019-12-26 | Discharge: 2019-12-27 | Disposition: A | Discharge status: Home / Self Care | Payer: Medicaid Other | Attending: Emergency Medicine | Admitting: Emergency Medicine

## 2019-12-26 DIAGNOSIS — M79675 Pain in left toe(s): Secondary | ICD-10-CM | POA: Diagnosis present

## 2019-12-26 DIAGNOSIS — Z79899 Other long term (current) drug therapy: Secondary | ICD-10-CM | POA: Insufficient documentation

## 2019-12-26 DIAGNOSIS — M109 Gout, unspecified: Secondary | ICD-10-CM | POA: Diagnosis not present

## 2019-12-26 DIAGNOSIS — Z87891 Personal history of nicotine dependence: Secondary | ICD-10-CM | POA: Insufficient documentation

## 2019-12-26 NOTE — ED Triage Notes (Signed)
Pt c/o pain to left great toe; he thinks he may have injured it in his sleep by hitting the dresser beside the bed; pt states the toe is swollen and hurts with pressure

## 2019-12-27 MED ORDER — PREDNISONE 20 MG PO TABS: 40.0000 mg | ORAL_TABLET | Freq: Every day | ORAL | 0 refills | Status: DC

## 2019-12-27 MED ORDER — HYDROCODONE-ACETAMINOPHEN 5-325 MG PO TABS: 1.0000 | ORAL_TABLET | Freq: Once | ORAL | Status: DC

## 2019-12-27 MED ORDER — HYDROCODONE-ACETAMINOPHEN 5-325 MG PO TABS: 1.0000 | ORAL_TABLET | ORAL | 0 refills | Status: DC | PRN

## 2019-12-27 MED ORDER — PREDNISONE 20 MG PO TABS
40.0000 mg | ORAL_TABLET | Freq: Once | ORAL | Status: AC
  Administered 2019-12-27: 01:00:00 40 mg via ORAL
  Filled 2019-12-27: qty 2

## 2019-12-27 NOTE — ED Provider Notes (Signed)
[facility] EMERGENCY DEPARTMENT Provider Note   CSN: [id number] Arrival date & time: 12/26/19  2226     History Chief Complaint  Patient presents with  . Foot Pain    Walter Lambert is a 34 y.o. male.  HPI      Walter Lambert is a 34 y.o. male who presents to the Emergency Department complaining of gradually worsening pain, redness, and swelling of his left great toe.  Symptoms have been present for several days.  He describes a throbbing pain at the proximal joint of the toe.  And states the pain is worse with weightbearing and with putting on his shoes.  Nothing makes his symptoms better.  He believes he may have struck his foot on a piece of furniture that sits beside his bed.  He denies fall.  No pain to the distal toe or pain around the toenail.  He denies numbness or tingling of his foot.  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   Past Medical History:  Diagnosis Date  . Anxiety   . Arthritis   . Depression   . GERD (gastroesophageal reflux disease)     Patient Active Problem List   Diagnosis Date Noted  . Visual changes 09/03/2019  . Pain in both knees 09/03/2019  . Autism spectrum disorder 09/03/2019  . Right wrist pain 09/03/2019  . Gastroesophageal reflux disease without esophagitis 09/03/2019    History reviewed. No pertinent surgical history.     Family History  Problem Relation Age of Onset  . Mental illness Mother   . Mental illness Father   . Diabetes Maternal Grandmother     Social History   Tobacco Use  . Smoking status: Former Research scientist (life sciences)  . Smokeless tobacco: Never Used  Substance Use Topics  . Alcohol use: Yes  . Drug use: Never    Home Medications Prior to Admission medications   Medication Sig Start Date End Date Taking? Authorizing Provider  omeprazole (PRILOSEC) 40 MG capsule Take 1 capsule (40 mg total) by mouth daily. 09/03/19   Corum, Rex Kras, MD    Allergies    Nsaids, Bee venom,  Cortisone, and Nickel  Review of Systems   Review of Systems  Constitutional: Negative for chills and fever.  Respiratory: Negative for shortness of breath.   Cardiovascular: Negative for chest pain.  Musculoskeletal: Positive for arthralgias and joint swelling.  Skin: Positive for color change (erythema proximal left great toe). Negative for wound.  Neurological: Negative for weakness and numbness.    Physical Exam Updated Vital Signs BP 113/73 (BP Location: Right Arm)   Pulse 62   Temp 98 F (36.7 C) (Oral)   Resp 20   Ht 6' 1.5" (1.867 m)   Wt 108.9 kg   SpO2 97%   BMI 31.23 kg/m   Physical Exam Vitals and nursing note reviewed.  Constitutional:      General: He is not in acute distress.    Appearance: Normal appearance.  Cardiovascular:     Rate and Rhythm: Normal rate and regular rhythm.     Pulses: Normal pulses.  Pulmonary:     Effort: Pulmonary effort is normal.     Breath sounds: Normal breath sounds.  Musculoskeletal:        General: Swelling and tenderness present.     Cervical back: Normal range of motion.     Comments: Focal tenderness to palpation of the first MTP joint of the left great  toe.  There is mild warmth and erythema noted.  No lymphangitis.  Distal toe is nontender and no evidence of ingrown toenail.  Ankle also nontender.  Skin:    General: Skin is warm.     Capillary Refill: Capillary refill takes less than 2 seconds.  Neurological:     General: No focal deficit present.     Mental Status: He is alert.     Sensory: No sensory deficit.     Motor: No weakness.     ED Results / Procedures / Treatments   Labs (all labs ordered are listed, but only abnormal results are displayed) Labs Reviewed - No data to display  EKG None  Radiology DG Toe Great Left  Result Date: 12/26/2019 CLINICAL DATA:  Toe injury EXAM: LEFT GREAT TOE COMPARISON:  None. FINDINGS: No fracture or malalignment. Mild degenerative changes at the first MTP joint.  IMPRESSION: No acute osseous abnormality Electronically Signed   By: Jasmine Pang M.D.   On: 12/26/2019 23:48    Procedures Procedures (including critical care time)  Medications Ordered in ED Medications  predniSONE (DELTASONE) tablet 40 mg (has no administration in time range)  HYDROcodone-acetaminophen (NORCO/VICODIN) 5-325 MG per tablet 1 tablet (has no administration in time range)    ED Course  I have reviewed the triage vital signs and the nursing notes.  Pertinent labs & imaging results that were available during my care of the patient were reviewed by me and considered in my medical decision making (see chart for details).    MDM Rules/Calculators/A&P                      Patient with pain of the proximal joint of the left great toe.  There is mild erythema and edema noted at the joint.  No tenderness to the distal toe.  NV intact.  Symptoms are likely related to gouty arthritis.  Patient has cortisone allergy listed, but states that he cannot take prednisone and he called his wife to verify this.  No concerning sx's for ingrown toenail and doubt cellulitis.     Final Clinical Impression(s) / ED Diagnoses Final diagnoses:  Gouty arthritis of left great toe    Rx / DC Orders ED Discharge Orders    None       Pauline Aus, PA-C 12/27/19 0134    Devoria Albe, MD 12/27/19 [phone]

## 2019-12-27 NOTE — Discharge Instructions (Addendum)
The x-ray of your left great toe this evening was negative for a broken bone.  Your pain is likely related to gout of your big toe.  Take the prednisone as directed until it is finished.  You may follow-up with your primary care provider for recheck.

## 2019-12-28 MED FILL — Hydrocodone-Acetaminophen Tab 5-325 MG: ORAL | Qty: 6 | Status: AC

## 2020-01-10 ENCOUNTER — Other Ambulatory Visit: Payer: Self-pay

## 2020-01-10 ENCOUNTER — Ambulatory Visit (INDEPENDENT_AMBULATORY_CARE_PROVIDER_SITE_OTHER): Payer: Medicaid Other | Admitting: Orthopedic Surgery

## 2020-01-10 ENCOUNTER — Encounter: Payer: Self-pay | Admitting: Orthopedic Surgery

## 2020-01-10 DIAGNOSIS — R202 Paresthesia of skin: Secondary | ICD-10-CM

## 2020-01-10 DIAGNOSIS — R0989 Other specified symptoms and signs involving the circulatory and respiratory systems: Secondary | ICD-10-CM | POA: Diagnosis not present

## 2020-01-10 DIAGNOSIS — R200 Anesthesia of skin: Secondary | ICD-10-CM | POA: Diagnosis not present

## 2020-01-11 ENCOUNTER — Encounter: Payer: Self-pay | Admitting: Orthopedic Surgery

## 2020-01-11 NOTE — Progress Notes (Signed)
Office Visit Note   Patient: Walter Lambert           Date of Birth: August 12, 1986           MRN: [id number] Visit Date: 01/10/2020 Requested by: Darreld Mclean, MD [address] [place],  [place] [address] PCP: Wandra Feinstein, MD  Subjective: No chief complaint on file.   HPI: Walter Lambert is a 34 y.o. male who presents to the office complaining of right upper extremity numbness/tingling.  Patient has been experiencing numbness/tingling in all 5 fingertips since January 2021.  He denies any radicular pain or much pain in any way.  He was seen by Dr. Alvester Morin who had a nerve conduction study/EMG done of the right upper extremity that revealed a chronic right-sided C5/C6 radiculopathy.  He followed up with Dr. Hilda Lias who ordered an MRI of the cervical spine that revealed small bone spurs at C3-4 and a tiny disc bulge without neural impingement at C6-C7.  Patient notes only very mild neck pain.  He notes occasional color changes in his right hand between the palm and the fingertips.  He has tried night splints without any relief.  EMG/nerve conduction study showed no evidence of carpal tunnel syndrome.  He has no recent injury that he can point to as the onset of his symptoms.              ROS:  All systems reviewed are negative as they relate to the chief complaint within the history of present illness.  Patient denies fevers or chills.  Assessment & Plan: Visit Diagnoses: No diagnosis found.  Plan: Patient is a 34 year old male who presents complaining of right upper extremity numbness and tingling in all 5 fingers.  He notes that symptoms are worst in the thumb, index, middle fingers.  He has had nerve conduction study that revealed a chronic C5/C6 radiculopathy but his MRI of the cervical spine was fairly unremarkable aside from a very small disc bulge without any nerve impingement.  He does have some tenderness to palpation in the axial cervical spine and on exam he has decreased sensation  throughout the entirety of his right arm compared with contralateral arm.  Additionally, his right radial pulse is noticeably diminished compared with his left side.  Allen test performed that was normal on the left hand but showed very slow return of blood flow to the hand when the radial artery was released.  Discussed options available to patient.  He does not want to do anything at this time as any further work-up would involve him missing work.  He is looking for a job promotion so this is not ideal.  Recommended that patient follow-up in the future when he decides that he wants to pursue further work-up.  At that time we will consider either a CT myelogram of the cervical spine to evaluate right radiculopathy with an arteriogram of the right wrist to evaluate abnormal Allen test with diminished radial pulse.  Patient agreed with this plan and will follow up in the future when his job situation gives him more freedom for what could be a fairly extensive work-up for an enigmatic problem at this time.  Follow-Up Instructions: No follow-ups on file.   Orders:  No orders of the defined types were placed in this encounter.  No orders of the defined types were placed in this encounter.     Procedures: No procedures performed   Clinical Data: No additional findings.  Objective: Vital Signs: There  were no vitals taken for this visit.  Physical Exam:  Constitutional: Patient appears well-developed HEENT:  Head: Normocephalic Eyes:EOM are normal Neck: Normal range of motion Cardiovascular: Normal rate Pulmonary/chest: Effort normal Neurologic: Patient is alert Skin: Skin is warm Psychiatric: Patient has normal mood and affect  Ortho Exam:  Mild to moderate tenderness palpation throughout the axial cervical spine.  No significant tenderness throughout the paraspinal musculature.  Negative Spurling test bilaterally.  Negative Hoffman test bilaterally.  Allen test performed that was normal  of the left hand.  On the right hand, Zenia Resides test was abnormal; less than 2 seconds of capillary refill when the ulnar artery was released but greater than 5 seconds until capillary refill in the radial artery was released.  Diminished right radial pulse compared with the contralateral arm.  Diminished sensation in all 5 fingers both palmarly and dorsally.  Diminished sensation of the entire right upper extremity compared with contralateral side.  Negative Tinel's test.  Positive Phalen's test.  5/5 motor strength of the bilateral grip, finger abduction, pronation/supination, bicep, tricep, deltoid.  Specialty Comments:  No specialty comments available.  Imaging: No results found.   PMFS History: Patient Active Problem List   Diagnosis Date Noted   Visual changes 09/03/2019   Pain in both knees 09/03/2019   Autism spectrum disorder 09/03/2019   Right wrist pain 09/03/2019   Gastroesophageal reflux disease without esophagitis 09/03/2019   Past Medical History:  Diagnosis Date   Anxiety    Arthritis    Depression    GERD (gastroesophageal reflux disease)     Family History  Problem Relation Age of Onset   Mental illness Mother    Mental illness Father    Diabetes Maternal Grandmother     History reviewed. No pertinent surgical history. Social History   Occupational History   Not on file  Tobacco Use   Smoking status: Former Smoker   Smokeless tobacco: Never Used  Substance and Sexual Activity   Alcohol use: Yes   Drug use: Never   Sexual activity: Not Currently

## 2020-01-13 ENCOUNTER — Encounter: Payer: Self-pay | Admitting: Orthopedic Surgery

## 2020-04-15 ENCOUNTER — Other Ambulatory Visit: Payer: Self-pay

## 2020-04-15 ENCOUNTER — Encounter (HOSPITAL_COMMUNITY): Payer: Self-pay | Admitting: Emergency Medicine

## 2020-04-15 ENCOUNTER — Emergency Department (HOSPITAL_COMMUNITY)
Admission: EM | Admit: 2020-04-15 | Discharge: 2020-04-15 | Disposition: A | Discharge status: Home / Self Care | Payer: Medicaid Other | Attending: Emergency Medicine | Admitting: Emergency Medicine

## 2020-04-15 DIAGNOSIS — Z87891 Personal history of nicotine dependence: Secondary | ICD-10-CM | POA: Insufficient documentation

## 2020-04-15 DIAGNOSIS — T63441A Toxic effect of venom of bees, accidental (unintentional), initial encounter: Secondary | ICD-10-CM | POA: Insufficient documentation

## 2020-04-15 NOTE — ED Provider Notes (Signed)
[facility]-EMERGENCY DEPT [facility] Emergency Department Provider Note MRN:  [id number]  Arrival date & time: 04/15/20     Chief Complaint   Insect Bite   History of Present Illness   Walter Lambert is a 34 y.o. year-old male with a history of anxiety presenting to the ED with chief complaint of insect.  Patient works as a Production assistant, radio, was stung by a bee while at work.  Stung on the back of the right leg.  Has experienced some swelling and discomfort to the leg since that time.  Denies trouble breathing, no throat or facial swelling, no nausea, no vomiting, no diarrhea, no chest pain, no shortness of breath, no abdominal pain.  Symptoms are mild to moderate, constant, no exacerbating or alleviating factors.  Review of Systems  A complete 10 system review of systems was obtained and all systems are negative except as noted in the HPI and PMH.   Patient's Health History    Past Medical History:  Diagnosis Date  . Anxiety   . Arthritis   . Depression   . GERD (gastroesophageal reflux disease)     History reviewed. No pertinent surgical history.  Family History  Problem Relation Age of Onset  . Mental illness Mother   . Mental illness Father   . Diabetes Maternal Grandmother     Social History   Socioeconomic History  . Marital status: Single    Spouse name: Not on file  . Number of children: Not on file  . Years of education: Not on file  . Highest education level: Not on file  Occupational History  . Not on file  Tobacco Use  . Smoking status: Former Games developer  . Smokeless tobacco: Never Used  Substance and Sexual Activity  . Alcohol use: Yes  . Drug use: Never  . Sexual activity: Not Currently  Other Topics Concern  . Not on file  Social History Narrative  . Not on file   Social Determinants of Health   Financial Resource Strain:   . Difficulty of Paying Living Expenses:   Food Insecurity:   . Worried About Programme researcher, broadcasting/film/video in the Last Year:   .  Barista in the Last Year:   Transportation Needs:   . Freight forwarder (Medical):   Marland Kitchen Lack of Transportation (Non-Medical):   Physical Activity:   . Days of Exercise per Week:   . Minutes of Exercise per Session:   Stress:   . Feeling of Stress :   Social Connections:   . Frequency of Communication with Friends and Family:   . Frequency of Social Gatherings with Friends and Family:   . Attends Religious Services:   . Active Member of Clubs or Organizations:   . Attends Banker Meetings:   Marland Kitchen Marital Status:   Intimate Partner Violence:   . Fear of Current or Ex-Partner:   . Emotionally Abused:   Marland Kitchen Physically Abused:   . Sexually Abused:      Physical Exam   Vitals:   04/15/20 2003  BP: (!) 143/93  Pulse: 81  Resp: 16  Temp: 98.4 F (36.9 C)  SpO2: 100%    CONSTITUTIONAL: Well-appearing, NAD NEURO:  Alert and oriented x 3, no focal deficits EYES:  eyes equal and reactive ENT/NECK:  no LAD, no JVD CARDIO: Regular rate, well-perfused, normal S1 and S2 PULM:  CTAB no wheezing or rhonchi GI/GU:  normal bowel sounds, non-distended, non-tender MSK/SPINE:  No gross  deformities, no edema, mild tenderness to palpation to the posterior aspect of the right distal calf SKIN:  no rash, atraumatic PSYCH:  Appropriate speech and behavior  *Additional and/or pertinent findings included in MDM below  Diagnostic and Interventional Summary    EKG Interpretation  Date/Time:    Ventricular Rate:    PR Interval:    QRS Duration:   QT Interval:    QTC Calculation:   R Axis:     Text Interpretation:        Labs Reviewed - No data to display  No orders to display    Medications - No data to display   Procedures  /  Critical Care Procedures  ED Course and Medical Decision Making  I have reviewed the triage vital signs, the nursing notes, and pertinent available records from the EMR.  Listed above are laboratory and imaging tests that I  personally ordered, reviewed, and interpreted and then considered in my medical decision making (see below for details).      Bee sting with localized tenderness and inflammation, nothing to suggest DVT, nothing to suggest anaphylaxis or significant allergic reaction.  Normal vital signs, neurovascular intact, appropriate for discharge.    Elmer Sow. Pilar Plate, MD [facility] Emergency Medicine [facility] [email or web address]  Final Clinical Impressions(s) / ED Diagnoses     ICD-10-CM   1. Bee sting, accidental or unintentional, initial encounter  T63.441A     ED Discharge Orders    None       Discharge Instructions Discussed with and Provided to Patient:     Discharge Instructions     You were evaluated in the Emergency Department and after careful evaluation, we did not find any emergent condition requiring admission or further testing in the hospital.  Your exam/testing today was overall reassuring.  Symptoms seem to be due to some swelling and inflammation from the bee sting.  Please use over-the-counter Benadryl as needed every 4-6 hours and elevate the leg over the next few days.  Please return to the Emergency Department if you experience any worsening of your condition.  We encourage you to follow up with a primary care provider.  Thank you for allowing [place] to be a part of your care.       Sabas Sous, MD 04/15/20 [phone]

## 2020-04-15 NOTE — Discharge Instructions (Addendum)
You were evaluated in the Emergency Department and after careful evaluation, we did not find any emergent condition requiring admission or further testing in the hospital.  Your exam/testing today was overall reassuring.  Symptoms seem to be due to some swelling and inflammation from the bee sting.  Please use over-the-counter Benadryl as needed every 4-6 hours and elevate the leg over the next few days.  Please return to the Emergency Department if you experience any worsening of your condition.  We encourage you to follow up with a primary care provider.  Thank you for allowing [place] to be a part of your care.

## 2020-04-15 NOTE — ED Notes (Signed)
Patient refused to sign and stated that this hospital was a joke.

## 2020-04-15 NOTE — ED Triage Notes (Signed)
Pt c/o of a bee sting on his right lower leg. Pt states he has a previous allergy to bee sting and does not have an epi pen . Red spot at stinger site noted. Pt denies trouble breathing or closure of throat.

## 2020-04-24 ENCOUNTER — Ambulatory Visit (INDEPENDENT_AMBULATORY_CARE_PROVIDER_SITE_OTHER): Payer: Medicaid Other | Admitting: Internal Medicine

## 2020-04-24 ENCOUNTER — Ambulatory Visit: Payer: Medicaid Other | Admitting: Internal Medicine

## 2020-04-24 ENCOUNTER — Other Ambulatory Visit: Payer: Self-pay

## 2020-04-24 VITALS — BP 114/70 | HR 58 | Resp 16 | Ht 73.5 in | Wt 222.0 lb

## 2020-04-24 DIAGNOSIS — K219 Gastro-esophageal reflux disease without esophagitis: Secondary | ICD-10-CM | POA: Diagnosis not present

## 2020-04-24 DIAGNOSIS — F419 Anxiety disorder, unspecified: Secondary | ICD-10-CM

## 2020-04-24 DIAGNOSIS — F319 Bipolar disorder, unspecified: Secondary | ICD-10-CM | POA: Diagnosis not present

## 2020-04-24 DIAGNOSIS — F840 Autistic disorder: Secondary | ICD-10-CM

## 2020-04-24 DIAGNOSIS — G4733 Obstructive sleep apnea (adult) (pediatric): Secondary | ICD-10-CM

## 2020-04-24 DIAGNOSIS — Z7689 Persons encountering health services in other specified circumstances: Secondary | ICD-10-CM

## 2020-04-24 DIAGNOSIS — Z114 Encounter for screening for human immunodeficiency virus [HIV]: Secondary | ICD-10-CM

## 2020-04-24 DIAGNOSIS — F259 Schizoaffective disorder, unspecified: Secondary | ICD-10-CM

## 2020-04-24 DIAGNOSIS — Z1159 Encounter for screening for other viral diseases: Secondary | ICD-10-CM

## 2020-04-24 DIAGNOSIS — T63441A Toxic effect of venom of bees, accidental (unintentional), initial encounter: Secondary | ICD-10-CM

## 2020-04-24 DIAGNOSIS — R7989 Other specified abnormal findings of blood chemistry: Secondary | ICD-10-CM

## 2020-04-24 DIAGNOSIS — F4310 Post-traumatic stress disorder, unspecified: Secondary | ICD-10-CM

## 2020-04-24 NOTE — Progress Notes (Signed)
New Patient Office Visit  Subjective:  Patient ID: Walter Lambert, male    DOB: 1986-07-27  Age: 34 y.o. MRN: [id number]  CC:  Chief Complaint  Patient presents with  . New Patient (Initial Visit)    establish care, wants a referral to an allergist, wants labs to check lipids,kidneys, and testerone levels    HPI Walter Lambert is 34 year old male with past medical history of bipolar 1 disorder, schizoaffective disorder, anxiety, PTSD, autism spectrum disorder, GERD, arthritis, presents in our clinic for the first time for establishment of care and discuss about his chronic medical issues and lab work.  Patient tells me that he has history of schizoaffective disorder, bipolar 1 disorder, autism, PTSD, generalized anxiety disorder-he moved from [place] and was on multiple antipsychotic medication including Abilify/lithium/Seroquel however he has not been taking medicine since in over a year.  He said that he stopped taking medicines because the medications makes him agitated/violent and extremely aggressive.  He tells me that he is allergic to bee stings and had multiple bee stings recently.  He went to ED for that however he was asked to take over-the-counter Benadryl and discharged home.  Reports allergy to metals and has rash on right upper thigh and requested allergy/immune allergy referral.  He tells me that he has history of low testosterone and was on testosterone shots and then testosterone gel in [place] however due to loss of insurance he is currently not on any treatment.  Requested to check for testosterone level.  He is a former smoker, drinks alcohol occasionally however denies illicit drug use.  He lives in a motel with his wife and 2 kids.  Past Medical History:  Diagnosis Date  . Allergy    Phreesia 04/24/2020  . Anxiety   . Arthritis   . Depression   . GERD (gastroesophageal reflux disease)   . Sleep apnea    Phreesia 04/24/2020    No past surgical history on  file.  Family History  Problem Relation Age of Onset  . Mental illness Mother   . Mental illness Father   . Diabetes Maternal Grandmother     Social History   Socioeconomic History  . Marital status: Single    Spouse name: Not on file  . Number of children: Not on file  . Years of education: Not on file  . Highest education level: Not on file  Occupational History  . Not on file  Tobacco Use  . Smoking status: Former Research scientist (life sciences)  . Smokeless tobacco: Never Used  Substance and Sexual Activity  . Alcohol use: Yes  . Drug use: Never  . Sexual activity: Not Currently  Other Topics Concern  . Not on file  Social History Narrative  . Not on file   Social Determinants of Health   Financial Resource Strain:   . Difficulty of Paying Living Expenses:   Food Insecurity:   . Worried About Charity fundraiser in the Last Year:   . Arboriculturist in the Last Year:   Transportation Needs:   . Film/video editor (Medical):   Marland Kitchen Lack of Transportation (Non-Medical):   Physical Activity:   . Days of Exercise per Week:   . Minutes of Exercise per Session:   Stress:   . Feeling of Stress :   Social Connections:   . Frequency of Communication with Friends and Family:   . Frequency of Social Gatherings with Friends and Family:   . Attends Religious Services:   .  Active Member of Clubs or Organizations:   . Attends Archivist Meetings:   Marland Kitchen Marital Status:   Intimate Partner Violence:   . Fear of Current or Ex-Partner:   . Emotionally Abused:   Marland Kitchen Physically Abused:   . Sexually Abused:     ROS Review of Systems  Constitutional: Negative.   HENT: Negative.   Eyes: Negative.   Respiratory: Negative.   Cardiovascular: Negative.   Gastrointestinal: Negative.   Endocrine: Negative.   Genitourinary: Negative.   Musculoskeletal: Negative.   Allergic/Immunologic: Positive for environmental allergies.  Neurological: Negative.   Hematological: Negative.     Psychiatric/Behavioral: The patient is nervous/anxious and is hyperactive.     Objective:   Today's Vitals: BP 114/70   Pulse (!) 58   Resp 16   Ht 6' 1.5" (1.867 m)   Wt 222 lb 0.6 oz (100.7 kg)   SpO2 98%   BMI 28.90 kg/m   Physical Exam  Assessment & Plan:   Problem List Items Addressed This Visit      Digestive   Gastroesophageal reflux disease without esophagitis    Other Visit Diagnoses    Schizo affective schizophrenia ([place])    -  Primary   Relevant Orders   Ambulatory referral to Psychiatry   CBC   Lipid panel   CMP14+EGFR   Bipolar 1 disorder ([place])       Relevant Orders   Ambulatory referral to Psychiatry   CBC   Lipid panel   CMP14+EGFR   OSA (obstructive sleep apnea)       PTSD (post-traumatic stress disorder)       Relevant Orders   Ambulatory referral to Psychiatry   Encounter to establish care       Relevant Orders   CBC   CMP14+EGFR   Bee sting, accidental or unintentional, initial encounter       Relevant Orders   Ambulatory referral to Allergy   Low testosterone       Relevant Orders   CBC   Testosterone, Free, Total, SHBG   CMP14+EGFR   Autism spectrum       Relevant Orders   CBC   CMP14+EGFR   Anxiety       Relevant Orders   Ambulatory referral to Psychiatry   CBC   HIV Antibody (routine testing w rflx)   CMP14+EGFR   Encounter for screening for HIV       Need for hepatitis C screening test       Relevant Orders   Hepatitis C antibody     Encounter to establish care:\ -Patient's care established.  Bipolar 1 disorder/schizoaffective disorder/PTSD/generalized anxiety disorder: Patient is currently not on any medication for.  He stopped taking medicine for more than a year. -I will refer him to psychiatrist for further evaluation and management.  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 -Referred patient to allergy/immunology  GERD: Continue PPI  Hepatitis C/HIV screening: -Test ordered.  Check basic labs such as CBC, CMP, lipid panel, HIV & Hep C. Follow-up in 3 months.  Outpatient Encounter Medications as of 04/24/2020  Medication Sig  . acetaminophen (TYLENOL) 500 MG tablet Take 1,000 mg by mouth every 6 (six) hours as needed for mild pain, moderate pain or headache.  . diphenhydrAMINE (BENADRYL) 25 MG tablet Take 25 mg by mouth every 6 (six) hours as needed for itching or  allergies.  Marland Kitchen omeprazole (PRILOSEC) 40 MG capsule Take 1 capsule (40 mg total) by mouth daily.  . [DISCONTINUED] HYDROcodone-acetaminophen (NORCO/VICODIN) 5-325 MG tablet Take 1 tablet by mouth every 4 (four) hours as needed. (Patient not taking: Reported on 04/24/2020)  . [DISCONTINUED] predniSONE (DELTASONE) 20 MG tablet Take 2 tablets (40 mg total) by mouth daily. (Patient not taking: Reported on 04/24/2020)   No facility-administered encounter medications on file as of 04/24/2020.    Follow-up: Return in about 3 months (around 07/25/2020).   Mckinley Jewel, MD

## 2020-04-24 NOTE — Patient Instructions (Signed)
Follow up in 3 months Check CBC, CMP, lipid panel, Testosterone level. Refer to psychiatry for multiple psych issues Refer to allergy/immunology for multiple bee stings allergy & metal allergy

## 2020-04-29 LAB — CMP14+EGFR
ALT: 38 IU/L (ref 0–44)
AST: 33 IU/L (ref 0–40)
Albumin/Globulin Ratio: 1.6 (ref 1.2–2.2)
Albumin: 4.6 g/dL (ref 4.0–5.0)
Alkaline Phosphatase: 67 IU/L (ref 48–121)
BUN/Creatinine Ratio: 15 (ref 9–20)
BUN: 17 mg/dL (ref 6–20)
Bilirubin Total: 0.3 mg/dL (ref 0.0–1.2)
CO2: 29 mmol/L (ref 20–29)
Calcium: 9.3 mg/dL (ref 8.7–10.2)
Chloride: 100 mmol/L (ref 96–106)
Creatinine, Ser: 1.17 mg/dL (ref 0.76–1.27)
GFR calc Af Amer: 93 mL/min/{1.73_m2} (ref >59)
GFR calc non Af Amer: 81 mL/min/{1.73_m2} (ref >59)
Globulin, Total: 2.8 g/dL (ref 1.5–4.5)
Glucose: 82 mg/dL (ref 65–99)
Potassium: 4.7 mmol/L (ref 3.5–5.2)
Sodium: 138 mmol/L (ref 134–144)
Total Protein: 7.4 g/dL (ref 6.0–8.5)

## 2020-04-29 LAB — CBC
Hematocrit: 41.5 % (ref 37.5–51.0)
Hemoglobin: 14.3 g/dL (ref 13.0–17.7)
MCH: 29.3 pg (ref 26.6–33.0)
MCHC: 34.5 g/dL (ref 31.5–35.7)
MCV: 85 fL (ref 79–97)
Platelets: 246 10*3/uL (ref 150–450)
RBC: 4.88 x10E6/uL (ref 4.14–5.80)
RDW: 12.8 % (ref 11.6–15.4)
WBC: 6.4 10*3/uL (ref 3.4–10.8)

## 2020-04-29 LAB — LIPID PANEL
Chol/HDL Ratio: 4.7 ratio (ref 0.0–5.0)
Cholesterol, Total: 216 mg/dL — ABNORMAL HIGH (ref 100–199)
HDL: 46 mg/dL (ref >39)
LDL Chol Calc (NIH): 145 mg/dL — ABNORMAL HIGH (ref 0–99)
Triglycerides: 141 mg/dL (ref 0–149)
VLDL Cholesterol Cal: 25 mg/dL (ref 5–40)

## 2020-04-29 LAB — TESTOSTERONE, FREE, TOTAL, SHBG
Sex Hormone Binding: 19 nmol/L (ref 16.5–55.9)
Testosterone, Free: 10.6 pg/mL (ref 8.7–25.1)
Testosterone: 265 ng/dL (ref 264–916)

## 2020-04-29 LAB — HIV ANTIBODY (ROUTINE TESTING W REFLEX): HIV Screen 4th Generation wRfx: NONREACTIVE

## 2020-04-29 LAB — HEPATITIS C ANTIBODY: Hep C Virus Ab: <0.1 s/co ratio (ref 0.0–0.9)

## 2020-05-13 ENCOUNTER — Telehealth (HOSPITAL_COMMUNITY): Payer: Self-pay | Admitting: Psychiatry

## 2020-05-13 NOTE — Telephone Encounter (Signed)
Called patient to schedule from referral, patient advised to speak with fiancee and added release is on file. I advised patient [facility] is different from a medical doctors office and he would need to confirm with our office that it is okay to speak with a non family member. Patient went on to say I was rude to fiancee when I called, I advised I was not rude I simply explained I could not release information but could leave a call back number, the patient stated that I was rude and I was recorded and stated "funck you" and to go and "fuck myself"...and I hung the phone up.

## 2020-05-13 NOTE — Telephone Encounter (Signed)
Called to schedule patient was not available, tried to leave number, male advised she could take information patient was at work and works all day Monday - Saturday. Male would not take number due to me not being willing to talk in detail with her.

## 2020-06-01 ENCOUNTER — Emergency Department (HOSPITAL_BASED_OUTPATIENT_CLINIC_OR_DEPARTMENT_OTHER)
Admission: EM | Admit: 2020-06-01 | Discharge: 2020-06-01 | Disposition: A | Discharge status: Home / Self Care | Payer: Medicaid Other | Attending: Emergency Medicine | Admitting: Emergency Medicine

## 2020-06-01 ENCOUNTER — Encounter (HOSPITAL_BASED_OUTPATIENT_CLINIC_OR_DEPARTMENT_OTHER): Payer: Self-pay | Admitting: Emergency Medicine

## 2020-06-01 ENCOUNTER — Emergency Department (HOSPITAL_BASED_OUTPATIENT_CLINIC_OR_DEPARTMENT_OTHER): Payer: Medicaid Other

## 2020-06-01 ENCOUNTER — Other Ambulatory Visit: Payer: Self-pay

## 2020-06-01 DIAGNOSIS — M542 Cervicalgia: Secondary | ICD-10-CM | POA: Diagnosis present

## 2020-06-01 DIAGNOSIS — R0789 Other chest pain: Secondary | ICD-10-CM | POA: Insufficient documentation

## 2020-06-01 DIAGNOSIS — R202 Paresthesia of skin: Secondary | ICD-10-CM | POA: Insufficient documentation

## 2020-06-01 DIAGNOSIS — Z87891 Personal history of nicotine dependence: Secondary | ICD-10-CM | POA: Diagnosis not present

## 2020-06-01 DIAGNOSIS — Y92410 Unspecified street and highway as the place of occurrence of the external cause: Secondary | ICD-10-CM | POA: Diagnosis not present

## 2020-06-01 DIAGNOSIS — M791 Myalgia, unspecified site: Secondary | ICD-10-CM | POA: Insufficient documentation

## 2020-06-01 DIAGNOSIS — Y999 Unspecified external cause status: Secondary | ICD-10-CM | POA: Insufficient documentation

## 2020-06-01 DIAGNOSIS — M7918 Myalgia, other site: Secondary | ICD-10-CM

## 2020-06-01 DIAGNOSIS — M545 Low back pain: Secondary | ICD-10-CM | POA: Diagnosis not present

## 2020-06-01 DIAGNOSIS — Y9389 Activity, other specified: Secondary | ICD-10-CM | POA: Diagnosis not present

## 2020-06-01 HISTORY — DX: Other shoulder lesions, unspecified shoulder: M75.80

## 2020-06-01 MED ORDER — ACETAMINOPHEN 500 MG PO TABS
1000.0000 mg | ORAL_TABLET | Freq: Once | ORAL | Status: AC
  Administered 2020-06-01: 1000 mg via ORAL
  Filled 2020-06-01: qty 2

## 2020-06-01 MED ORDER — METHOCARBAMOL 500 MG PO TABS: 500.0000 mg | ORAL_TABLET | Freq: Two times a day (BID) | ORAL | 0 refills | Status: AC
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
## 2020-06-01 NOTE — ED Notes (Signed)
AVS reviewed with pt, along with discussion in regards to Rx by EDP as well, opportunity for questions provided, ED PA also provided information for a neurological consult, that information also reviewed and share with pt as well. Copy of AVS given to pt
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
## 2020-06-01 NOTE — ED Notes (Signed)
No bruising or discoloration noted on thoracic/ abd area
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
## 2020-06-04 ENCOUNTER — Ambulatory Visit: Payer: Medicaid Other | Admitting: Allergy & Immunology

## 2020-12-01 ENCOUNTER — Other Ambulatory Visit: Payer: Self-pay | Admitting: Family Medicine
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
## 2022-12-09 ENCOUNTER — Encounter: Payer: Self-pay | Admitting: Radiology

## 2024-04-17 ENCOUNTER — Other Ambulatory Visit: Payer: Self-pay | Admitting: Medical Genetics

## 2024-07-21 ENCOUNTER — Other Ambulatory Visit: Payer: Self-pay | Admitting: Medical Genetics

## 2024-07-21 DIAGNOSIS — Z006 Encounter for examination for normal comparison and control in clinical research program: Secondary | ICD-10-CM
